# Patient Record
Sex: Female | Born: 2005 | Race: Black or African American | Hispanic: Yes | Marital: Single | Smoking: Never smoker
Health system: Southern US, Community
[De-identification: ages and names within clinical notes are randomized; demographics above are authoritative.]

## PROBLEM LIST (undated history)

## (undated) DIAGNOSIS — R51 Headache: Secondary | ICD-10-CM

## (undated) DIAGNOSIS — F909 Attention-deficit hyperactivity disorder, unspecified type: Secondary | ICD-10-CM

## (undated) DIAGNOSIS — R519 Headache, unspecified: Secondary | ICD-10-CM

## (undated) HISTORY — DX: Headache: R51

## (undated) HISTORY — DX: Headache, unspecified: R51.9

---
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
## 2025-02-07 ENCOUNTER — Ambulatory Visit: Payer: MEDICAID | Admitting: Physician Assistant
